# Patient Record
Sex: Male | Born: 1960 | Race: Black or African American | Hispanic: No | Marital: Married | Smoking: Never smoker
Health system: Southern US, Community
[De-identification: ages and names within clinical notes are randomized; demographics above are authoritative.]

---

## 2017-05-05 ENCOUNTER — Encounter (HOSPITAL_COMMUNITY): Payer: Self-pay | Admitting: Emergency Medicine

## 2017-05-05 ENCOUNTER — Emergency Department (HOSPITAL_COMMUNITY)
Admission: EM | Admit: 2017-05-05 | Discharge: 2017-05-05 | Disposition: A | Discharge status: Home / Self Care | Payer: BLUE CROSS/BLUE SHIELD | Attending: Emergency Medicine | Admitting: Emergency Medicine

## 2017-05-05 ENCOUNTER — Emergency Department (HOSPITAL_COMMUNITY): Payer: BLUE CROSS/BLUE SHIELD

## 2017-05-05 DIAGNOSIS — Z79899 Other long term (current) drug therapy: Secondary | ICD-10-CM | POA: Diagnosis not present

## 2017-05-05 DIAGNOSIS — R1013 Epigastric pain: Secondary | ICD-10-CM | POA: Insufficient documentation

## 2017-05-05 LAB — CBC
HCT: 51.9 % (ref 39.0–52.0)
Hemoglobin: 18.2 g/dL — ABNORMAL HIGH (ref 13.0–17.0)
MCH: 32.6 pg (ref 26.0–34.0)
MCHC: 35.1 g/dL (ref 30.0–36.0)
MCV: 93 fL (ref 78.0–100.0)
PLATELETS: 185 10*3/uL (ref 150–400)
RBC: 5.58 MIL/uL (ref 4.22–5.81)
RDW: 13.5 % (ref 11.5–15.5)
WBC: 5.3 10*3/uL (ref 4.0–10.5)

## 2017-05-05 LAB — COMPREHENSIVE METABOLIC PANEL
ALK PHOS: 58 U/L (ref 38–126)
ALT: 25 U/L (ref 17–63)
AST: 23 U/L (ref 15–41)
Albumin: 4.2 g/dL (ref 3.5–5.0)
Anion gap: 8 (ref 5–15)
BILIRUBIN TOTAL: 0.6 mg/dL (ref 0.3–1.2)
BUN: 5 mg/dL — AB (ref 6–20)
CALCIUM: 9.3 mg/dL (ref 8.9–10.3)
CHLORIDE: 104 mmol/L (ref 101–111)
CO2: 24 mmol/L (ref 22–32)
CREATININE: 1.14 mg/dL (ref 0.61–1.24)
GFR calc Af Amer: >60 mL/min (ref >60)
Glucose, Bld: 107 mg/dL — ABNORMAL HIGH (ref 65–99)
Potassium: 3.8 mmol/L (ref 3.5–5.1)
Sodium: 136 mmol/L (ref 135–145)
Total Protein: 7.8 g/dL (ref 6.5–8.1)

## 2017-05-05 LAB — LIPASE, BLOOD: Lipase: 26 U/L (ref 11–51)

## 2017-05-05 MED ORDER — HYDROMORPHONE HCL 1 MG/ML IJ SOLN
1.0000 mg | Freq: Once | INTRAMUSCULAR | Status: AC
  Administered 2017-05-05: 1 mg via INTRAVENOUS
  Filled 2017-05-05: qty 1

## 2017-05-05 MED ORDER — ONDANSETRON HCL 4 MG/2ML IJ SOLN
4.0000 mg | Freq: Once | INTRAMUSCULAR | Status: AC
  Administered 2017-05-05: 4 mg via INTRAVENOUS
  Filled 2017-05-05: qty 2

## 2017-05-05 MED ORDER — IOPAMIDOL (ISOVUE-300) INJECTION 61%
INTRAVENOUS | Status: AC
  Administered 2017-05-05: 100 mL via INTRAVENOUS
  Filled 2017-05-05: qty 100

## 2017-05-05 NOTE — ED Notes (Signed)
Patient transported to CT 

## 2017-05-05 NOTE — ED Triage Notes (Signed)
[facility]. Pt reports umbilicus abdominal pain onset yesterday. Hernia noted. Patient in NAD at present but states "they tried to push it back in today and it wouldn't go back." VSS. States BM this morning with some constipation, denies nausea, vomiting and diarrhea.

## 2017-05-05 NOTE — ED Notes (Signed)
IV team at bedside 

## 2017-05-05 NOTE — ED Provider Notes (Signed)
[facility]-EMERGENCY DEPT Provider Note   CSN: [id number] Arrival date & [id number]: 05/05/17  1510     History   Chief Complaint Chief Complaint  Patient presents with  . Abdominal Pain    HPI Carlos LankBruce Orozco is a 56 y.o. male.  Patient states that he has a protrusion from his umbilicus. It has been protruding out since yesterday morning and has been hurting ever since then the pain is getting worse   The history is provided by the patient.  Abdominal Pain   This is a new problem. The current episode started yesterday. The problem occurs constantly. The problem has not changed since onset.The pain is associated with an unknown factor. The pain is located in the epigastric region. The quality of the pain is aching. The pain is at a severity of 5/10. The pain is moderate. Associated symptoms include anorexia. Pertinent negatives include diarrhea, frequency, hematuria and headaches.    History reviewed. No pertinent past medical history.  There are no active problems to display for this patient.   History reviewed. No pertinent surgical history.     Home Medications    Prior to Admission medications   Medication Sig Start Date End Date Taking? Authorizing Provider  cetirizine-pseudoephedrine (ZYRTEC-D) 5-120 MG tablet Take 1 tablet by mouth 2 (two) times daily as needed for allergies or rhinitis.   Yes [provider]    Family History History reviewed. No pertinent family history.  Social History Social History  Substance Use Topics  . Smoking status: Never Smoker  . Smokeless tobacco: Never Used  . Alcohol use No     Allergies   Patient has no known allergies.   Review of Systems Review of Systems  Constitutional: Negative for appetite change and fatigue.  HENT: Negative for congestion, ear discharge and sinus pressure.   Eyes: Negative for discharge.  Respiratory: Negative for cough.   Cardiovascular: Negative for chest pain.  Gastrointestinal: Positive  for abdominal pain and anorexia. Negative for diarrhea.  Genitourinary: Negative for frequency and hematuria.  Musculoskeletal: Negative for back pain.  Skin: Negative for rash.  Neurological: Negative for seizures and headaches.  Psychiatric/Behavioral: Negative for hallucinations.     Physical Exam Updated Vital Signs BP (!) 149/94 (BP Location: Left Arm)   Pulse 75   Temp 98 F (36.7 C) (Oral)   Resp 16   Ht 5\' 9"  (1.753 m)   Wt 114.3 kg (252 lb)   SpO2 95%   BMI 37.21 kg/m   Physical Exam  Constitutional: He is oriented to person, place, and time. He appears well-developed.  HENT:  Head: Normocephalic.  Eyes: Conjunctivae and EOM are normal. No scleral icterus.  Neck: Neck supple. No thyromegaly present.  Cardiovascular: Normal rate and regular rhythm.  Exam reveals no gallop and no friction rub.   No murmur heard. Pulmonary/Chest: No stridor. He has no wheezes. He has no rales. He exhibits no tenderness.  Abdominal: He exhibits no distension. There is tenderness. There is no rebound.  Patient has a umbilical hernia with tissue protruding out. I was able to reduce the hernia with significant pressure  Musculoskeletal: Normal range of motion. He exhibits no edema.  Lymphadenopathy:    He has no cervical adenopathy.  Neurological: He is oriented to person, place, and time. He exhibits normal muscle tone. Coordination normal.  Skin: No rash noted. No erythema.  Psychiatric: He has a normal mood and affect. His behavior is normal.     ED Treatments / Results  Labs (all labs ordered are listed, but only abnormal results are displayed) Labs Reviewed  COMPREHENSIVE METABOLIC PANEL - Abnormal; Notable for the following:       Result Value   Glucose, Bld 107 (*)    BUN 5 (*)    All other components within normal limits  CBC - Abnormal; Notable for the following:    Hemoglobin 18.2 (*)    All other components within normal limits  LIPASE, BLOOD    EKG  EKG  Interpretation None       Radiology Ct Abdomen Pelvis W Contrast  Result Date: 05/05/2017 CLINICAL DATA:  56 year old male with umbilical hernia presenting with abdominal pain at the umbilicus. EXAM: CT ABDOMEN AND PELVIS WITH CONTRAST TECHNIQUE: Multidetector CT imaging of the abdomen and pelvis was performed using the standard protocol following bolus administration of intravenous contrast. CONTRAST:  ISOVUE-300 IOPAMIDOL (ISOVUE-300) INJECTION 61% COMPARISON:  None. FINDINGS: Lower chest: The visualized lung bases are clear. No intra-abdominal free air. There is mesenteric stranding without significant free fluid. Hepatobiliary: No focal liver abnormality is seen. No gallstones, gallbladder wall thickening, or biliary dilatation. Pancreas: Unremarkable. No pancreatic ductal dilatation or surrounding inflammatory changes. Spleen: Normal in size without focal abnormality. Adrenals/Urinary Tract: Adrenal glands are unremarkable. Kidneys are normal, without renal calculi, focal lesion, or hydronephrosis. Bladder is unremarkable. Layering high attenuating content within the bladder may represent excreted contrast or proteinaceous debris. Stomach/Bowel: There is mild thickening and inflammatory changes of loops of small bowel in the mid abdomen at the level of the umbilicus. Fluid-filled content noted within these loops of bowel. There is a focal area of caliber change in the small bowel (series 4, image 66 and coronal series 7, image 33) there is inflammatory changes of the associated mesentery. There is stranding and inflammatory changes of a small fat containing umbilical hernia. Findings may represent a strangulated or incarcerated fat containing umbilical hernia with associated reactive inflammation and ileus of the adjacent small bowel or alternatively findings may represent enteritis with reactive ileus or less likely an early/low grade small bowel obstruction. Clinical correlation is recommended.  Small scattered colonic diverticula noted. There is no inflammatory changes of the colon. The appendix is unremarkable. Vascular/Lymphatic: No significant vascular findings are present. No enlarged abdominal or pelvic lymph nodes. Reproductive: The prostate and seminal vesicles are grossly unremarkable. Other: A 2.2 cm peritoneal defect at the level of the umbilicus with a 5.2 x 4.6 cm fat containing umbilical hernia. Inflammatory changes and stranding of the herniated fat may strangulation/ incarceration or extension of the inflammatory changes of the adjacent bowel. Musculoskeletal: Mild degenerative changes of the spine. No acute fracture. IMPRESSION: Inflammatory changes of the loops of small bowel in the mid abdomen as well as inflammatory changes of fat containing umbilical hernia. Findings may represent a strangulated/incarcerated fat containing umbilical hernia with reactive inflammatory changes of the adjacent bowel or an enteritis with associated ileus and extension of the inflammatory changes into the umbilical hernia. Clinical correlation is recommended. An early/low grade small bowel obstruction is less likely. Electronically Signed   By: Elgie Collard M.D.   On: 05/05/2017 20:24    Procedures Procedures (including critical care time)  Medications Ordered in ED Medications  HYDROmorphone (DILAUDID) injection 1 mg (1 mg Intravenous Given 05/05/17 1901)  ondansetron (ZOFRAN) injection 4 mg (4 mg Intravenous Given 05/05/17 1901)  iopamidol (ISOVUE-300) 61 % injection (100 mLs Intravenous Contrast Given 05/05/17 1920)     Initial Impression / Assessment and  Plan / ED Course  I have reviewed the triage vital signs and the nursing notes.  Pertinent labs & imaging results that were available during my care of the patient were reviewed by me and considered in my medical decision making (see chart for details).     Patient with umbilical hernia. I spoke to general surgery and they will  follow him up in the office. The patient was instructed if the hernia occurs again and will not reduce easily he needs come back to the hospital  Final Clinical Impressions(s) / ED Diagnoses   Final diagnoses:  Epigastric pain    New Prescriptions New Prescriptions   No medications on file     Bethann Berkshire, MD 05/05/17 2048

## 2017-05-05 NOTE — Discharge Instructions (Signed)
Follow-up with Dr. Lindie SpruceWyatt or one of his partners in the next week. Return if the hernia comes back.

## 2017-05-05 NOTE — ED Notes (Signed)
Waiting for IV team °

## 2017-05-07 ENCOUNTER — Ambulatory Visit: Payer: Self-pay | Admitting: Surgery

## 2017-05-07 NOTE — H&P (Signed)
Carlos Orozco 05/06/2017 4:32 PM Location: [facility] Patient #: [id number] DOB: 1961-07-20 Married / Language: English / Race: Refused to Report/Unreported Male   History of Present Illness Carlos Sportsman MD; 05/07/2017 9:57 AM) The patient is a 56 year old male who presents with an umbilical hernia. Note for "Umbilical hernia": ` ` ` Patient sent for surgical consultation at the request of Dr. Raquel Orozco ED  Chief Complaint: Painful hernia at bellybutton  The patient is a pleasant active male. He's noticed a lump at his bellybutton for the past 6 months or so. His noted increasing sharp pains in the area for the past few weeks. Last weekend he awoke with severe intense pain. Able to massage and reduce the lump down. However the next day it was out again and even more painful. Could not get it to reduce. Concerned him. They went to the emergency room. CAT scan noted omentum within a umbilical hernia. Eventually the ER physician was able to reduce it. It was recommended that surgical consultation be made. We had an opening so the patient could be seen the following day. Patient has not had any prior abdominal nor hernia surgery. He does not smoke. Does have moderate activity at his job. He can walk a half hour without difficulty. No cardiopulmonary issues. Usually moves his bowels every day or so. He does feel like his bowels a been moving "a little slow"  No personal nor family history of GI/colon cancer, inflammatory bowel disease, irritable bowel syndrome, allergy such as Celiac Sprue, dietary/dairy problems, colitis, ulcers nor gastritis. No recent sick contacts/gastroenteritis. No travel outside the country. No changes in diet. No dysphagia to solids or liquids. No significant heartburn or reflux. No hematochezia, hematemesis, coffee ground emesis. No evidence of prior gastric/peptic ulceration.  (Review of systems as stated in this history  (HPI) or in the review of systems. Otherwise all other 12 point ROS are negative)   Past Surgical History Carlos Orozco, CMA; 05/06/2017 4:37 PM) Ventral / Umbilical Hernia Surgery  multiple  Diagnostic Studies History Carlos Orozco, CMA; 05/06/2017 4:37 PM) Colonoscopy  never  Allergies Carlos Orozco, CMA; 05/06/2017 4:38 PM) No Known Drug Allergies 05/06/2017 Allergies Reconciled   Medication History Carlos Orozco, CMA; 05/06/2017 4:39 PM) Medications Reconciled  Social History Carlos Orozco, CMA; 05/06/2017 4:37 PM) Alcohol use  Occasional alcohol use. Caffeine use  Tea. No drug use  Tobacco use  Never smoker.  Family History Carlos Orozco, [place]; 05/06/2017 4:37 PM) Heart Disease  Mother.  Other Problems Carlos Orozco, CMA; 05/06/2017 4:37 PM) Umbilical Hernia Repair     Review of Systems Carlos Orozco CMA; 05/06/2017 4:37 PM) General Not Present- Appetite Loss, Chills, Fatigue, Fever, Night Sweats, Weight Gain and Weight Loss. Skin Not Present- Change in Wart/Mole, Dryness, Hives, Jaundice, New Lesions, Non-Healing Wounds, Rash and Ulcer. HEENT Not Present- Earache, Hearing Loss, Hoarseness, Nose Bleed, Oral Ulcers, Ringing in the Ears, Seasonal Allergies, Sinus Pain, Sore Throat, Visual Disturbances, Wears glasses/contact lenses and Yellow Eyes. Respiratory Not Present- Bloody sputum, Chronic Cough, Difficulty Breathing, Snoring and Wheezing. Breast Not Present- Breast Mass, Breast Pain, Nipple Discharge and Skin Changes. Cardiovascular Not Present- Chest Pain, Difficulty Breathing Lying Down, Leg Cramps, Palpitations, Rapid Heart Rate, Shortness of Breath and Swelling of Extremities. Gastrointestinal Not Present- Abdominal Pain, Bloating, Bloody Stool, Change in Bowel Habits, Chronic diarrhea, Constipation, Difficulty Swallowing, Excessive gas, Gets full quickly at meals, Hemorrhoids, Indigestion, Nausea, Rectal Pain and Vomiting. Male Genitourinary Not  Present-  Blood in Urine, Change in Urinary Stream, Frequency, Impotence, Nocturia, Painful Urination, Urgency and Urine Leakage.  Vitals Carlos Orozco(Carlos Orozco CMA; 05/06/2017 4:39 PM) 05/06/2017 4:39 PM Weight: 248.4 lb Height: 69in Body Surface Area: 2.26 m Body Mass Index: 36.68 kg/m  Temp.: 97.24F  Pulse: 58 (Regular)  BP: 120/82 (Sitting, Left Arm, Standard)       Physical Exam Carlos Orozco(Carlos Belling C. Lastacia Solum MD; 05/06/2017 5:04 PM) General Mental Status-Alert. General Appearance-Not in acute distress, Not Sickly. Orientation-Oriented X3. Hydration-Well hydrated. Voice-Normal.  Integumentary Global Assessment Upon inspection and palpation of skin surfaces of the - Axillae: non-tender, no inflammation or ulceration, no drainage. and Distribution of scalp and body hair is normal. General Characteristics Temperature - normal warmth is noted.  Head and Neck Head-normocephalic, atraumatic with no lesions or palpable masses. Face Global Assessment - atraumatic, no absence of expression. Neck Global Assessment - no abnormal movements, no bruit auscultated on the right, no bruit auscultated on the left, no decreased range of motion, non-tender. Trachea-midline. Thyroid Gland Characteristics - non-tender.  Eye Eyeball - Left-Extraocular movements intact, No Nystagmus. Eyeball - Right-Extraocular movements intact, No Nystagmus. Cornea - Left-No Hazy. Cornea - Right-No Hazy. Sclera/Conjunctiva - Left-No scleral icterus, No Discharge. Sclera/Conjunctiva - Right-No scleral icterus, No Discharge. Pupil - Left-Direct reaction to light normal. Pupil - Right-Direct reaction to light normal.  ENMT Ears Pinna - Left - no drainage observed, no generalized tenderness observed. Right - no drainage observed, no generalized tenderness observed. Nose and Sinuses External Inspection of the Nose - no destructive lesion observed. Inspection of the nares - Left - quiet  respiration. Right - quiet respiration. Mouth and Throat Lips - Upper Lip - no fissures observed, no pallor noted. Lower Lip - no fissures observed, no pallor noted. Nasopharynx - no discharge present. Oral Cavity/Oropharynx - Tongue - no dryness observed. Oral Mucosa - no cyanosis observed. Hypopharynx - no evidence of airway distress observed.  Chest and Lung Exam Inspection Movements - Normal and Symmetrical. Accessory muscles - No use of accessory muscles in breathing. Palpation Palpation of the chest reveals - Non-tender. Auscultation Breath sounds - Normal and Clear.  Cardiovascular Auscultation Rhythm - Regular. Murmurs & Other Heart Sounds - Auscultation of the heart reveals - No Murmurs and No Systolic Clicks.  Abdomen Inspection Inspection of the abdomen reveals - No Visible peristalsis and No Abnormal pulsations. Umbilicus - No Bleeding, No Urine drainage. Palpation/Percussion Palpation and Percussion of the abdomen reveal - Soft, Non Tender, No Rebound tenderness, No Rigidity (guarding) and No Cutaneous hyperesthesia. Note: Obese but soft. 5 cm periumbilical mass reduces down to 2 cm defect. Consistent with umbilical hernia. No diastases. No pain elsewhere. No rebound tenderness. No guarding.   Male Genitourinary Sexual Maturity Tanner 5 - Adult hair pattern and Adult penile size and shape. Note: No inguinal hernias. Normal external genitalia. Epididymi, testes, and spermatic cords normal without any masses.   Peripheral Vascular Upper Extremity Inspection - Left - No Cyanotic nailbeds, Not Ischemic. Right - No Cyanotic nailbeds, Not Ischemic.  Neurologic Neurologic evaluation reveals -normal attention span and ability to concentrate, able to name objects and repeat phrases. Appropriate fund of knowledge , normal sensation and normal coordination. Mental Status Affect - not angry, not paranoid. Cranial Nerves-Normal  Bilaterally. Gait-Normal.  Neuropsychiatric Mental status exam performed with findings of-able to articulate well with normal speech/language, rate, volume and coherence, thought content normal with ability to perform basic computations and apply abstract reasoning and no evidence of hallucinations, delusions, obsessions or  homicidal/suicidal ideation.  Musculoskeletal Global Assessment Spine, Ribs and Pelvis - no instability, subluxation or laxity. Right Upper Extremity - no instability, subluxation or laxity.  Lymphatic Head & Neck  General Head & Neck Lymphatics: Bilateral - Description - No Localized lymphadenopathy. Axillary  General Axillary Region: Bilateral - Description - No Localized lymphadenopathy. Femoral & Inguinal  Generalized Femoral & Inguinal Lymphatics: Left - Description - No Localized lymphadenopathy. Right - Description - No Localized lymphadenopathy.    Assessment & Plan Carlos Sportsman MD; 05/07/2017 9:55 AM) UMBILICAL HERNIA WITHOUT OBSTRUCTION AND WITHOUT GANGRENE (K42.9) Impression: Umbilical hernia increasingly symptomatic with an episode of incarceration. Requires repair.  Given his young age and obesity and defect greater than 1 cm, I think this will require mesh reinforcement. I would recommend laparoscopic exploration with underlay repair  I discussed postop recovery and return to work. Patient and his spouse are interested in proceeding. PREOP - [facility] - ENCOUNTER FOR PREOPERATIVE EXAMINATION FOR GENERAL SURGICAL PROCEDURE (Z01.818) Current Plans You are being scheduled for surgery- Our schedulers will call you.  You should hear from our office's scheduling department within 5 working days about the location, date, and time of surgery. We try to make accommodations for patient's preferences in scheduling surgery, but sometimes the OR schedule or the surgeon's schedule prevents [place] from making those accommodations.  If you have not heard from our  office [phone]) in 5 working days, call the office and ask for your surgeon's nurse.  If you have other questions about your diagnosis, plan, or surgery, call the office and ask for your surgeon's nurse.  Written instructions provided The anatomy & physiology of the abdominal wall was discussed. The pathophysiology of hernias was discussed. Natural history risks without surgery including progeressive enlargement, pain, incarceration, & strangulation was discussed. Contributors to complications such as smoking, obesity, diabetes, prior surgery, etc were discussed.  I feel the risks of no intervention will lead to serious problems that outweigh the operative risks; therefore, I recommended surgery to reduce and repair the hernia. I explained laparoscopic techniques with possible need for an open approach. I noted the probable use of mesh to patch and/or buttress the hernia repair  Risks such as bleeding, infection, abscess, need for further treatment, heart attack, death, and other risks were discussed. I noted a good likelihood this will help address the problem. Goals of post-operative recovery were discussed as well. Possibility that this will not correct all symptoms was explained. I stressed the importance of low-impact activity, aggressive pain control, avoiding constipation, & not pushing through pain to minimize risk of post-operative chronic pain or injury. Possibility of reherniation especially with smoking, obesity, diabetes, immunosuppression, and other health conditions was discussed. We will work to minimize complications.  An educational handout further explaining the pathology & treatment options was given as well. Questions were answered. The patient expresses understanding & wishes to proceed with surgery.  Pt Education - [facility] Hernia Post-Op HCI (Jagdeep Ancheta): discussed with patient and provided information. Pt Education - [facility] Pain Control (Mandell Pangborn) Pt Education - Pamphlet  Given - Laparoscopic Hernia Repair: discussed with patient and provided information.   Signed by Carlos Sportsman, MD (05/07/2017 9:58 AM)  Carlos Orozco, M.D., F.A.C.S. Gastrointestinal and Minimally Invasive Surgery [facility] [address] [place], [place] [address] [phone] Main / Paging

## 2018-01-16 IMAGING — CT CT ABD-PELV W/ CM
2 of 5 series · 16 of 46 positions shown, 18 images · IV contrast (iopamidol)
Comparison: None.

CLINICAL DATA: 55-year-old male with umbilical hernia presenting
with abdominal pain at the umbilicus.

EXAM:
CT ABDOMEN AND PELVIS WITH CONTRAST
TECHNIQUE: Multidetector CT imaging of the abdomen and pelvis was performed
using the standard protocol following bolus administration of
intravenous contrast.
CONTRAST:  100mL [id number]-Q22 IOPAMIDOL (VY3I0G-Q22) INJECTION 61%

[Series 4: abd/ pelvis 5.0 i30f 2 · axial · 0.88mm/px · z∈[+780,+1234]mm · 13 of 103 slices shown, 15 images]
[im 6/103  soft-tissue]
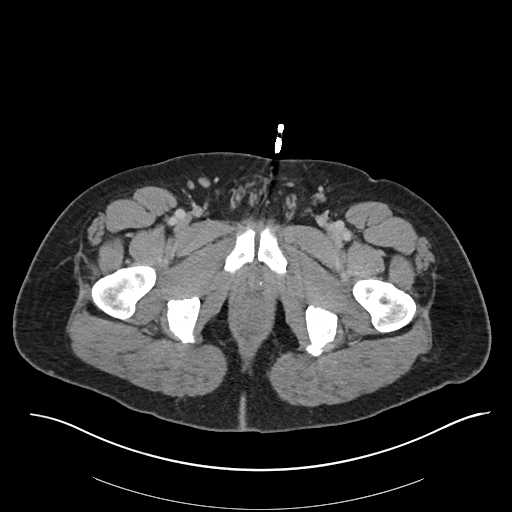
[im 6/103  bone]
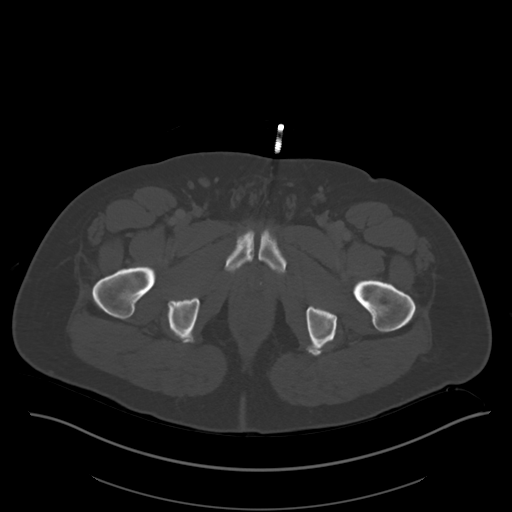
[im 17/103  soft-tissue]
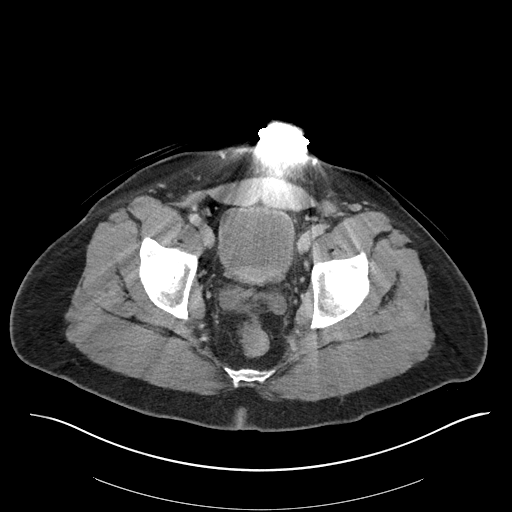
[im 22/103  soft-tissue]
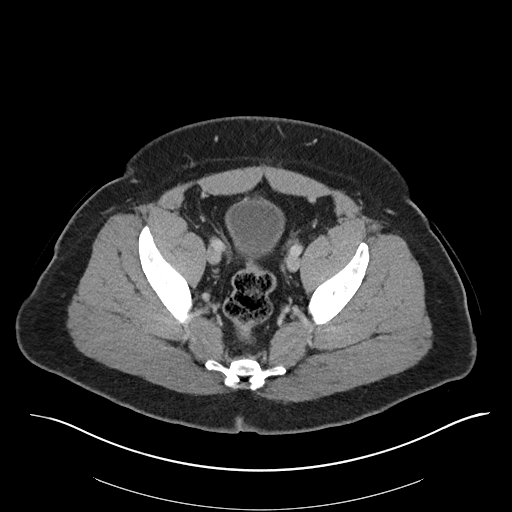
[im 27/103  soft-tissue]
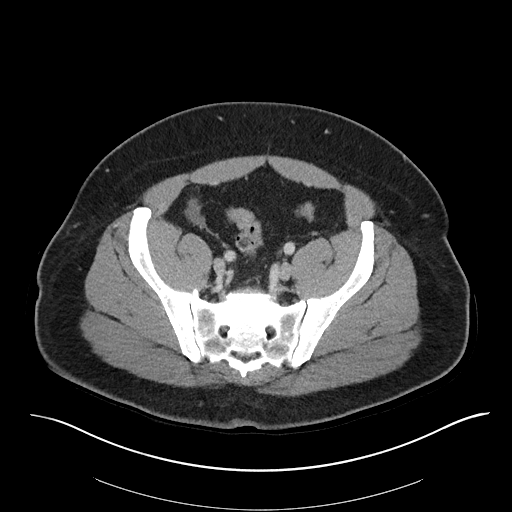
[im 38/103  soft-tissue]
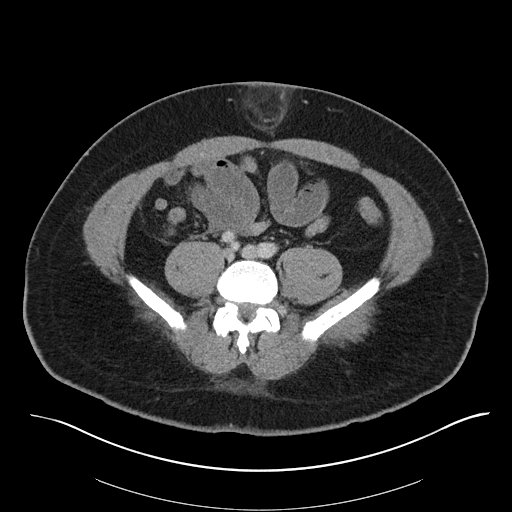
[im 43/103  soft-tissue]
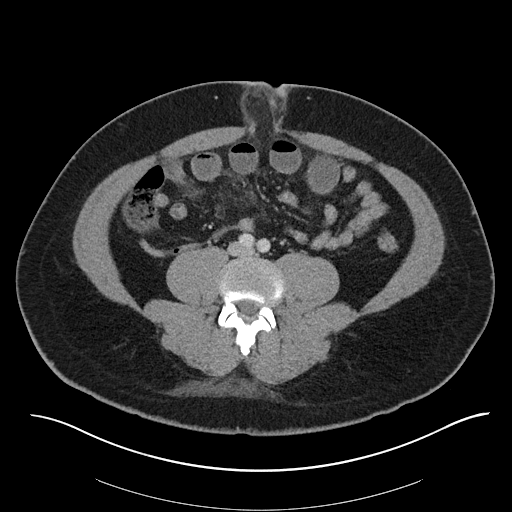
[im 54/103  soft-tissue]
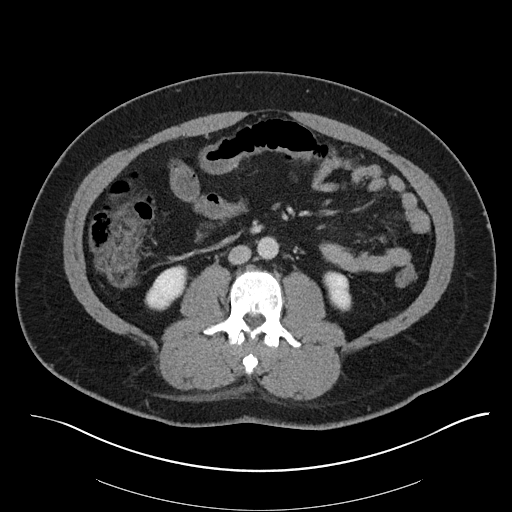
[im 60/103  soft-tissue]
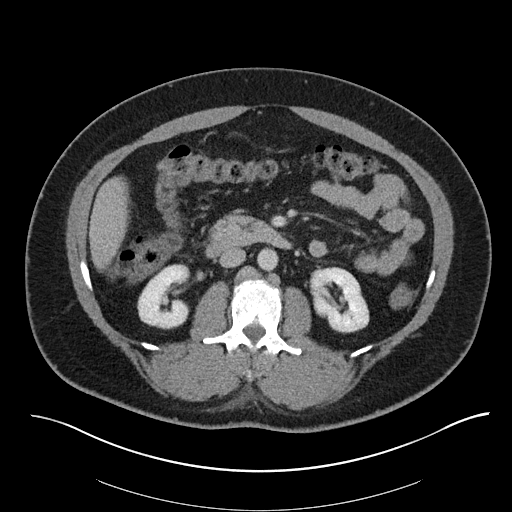
[im 65/103  soft-tissue]
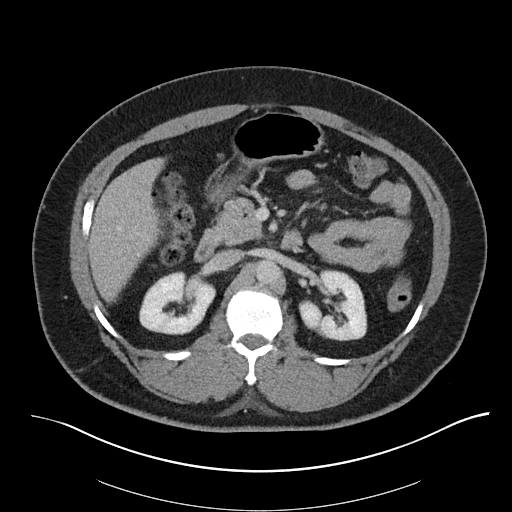
[im 65/103  bone]
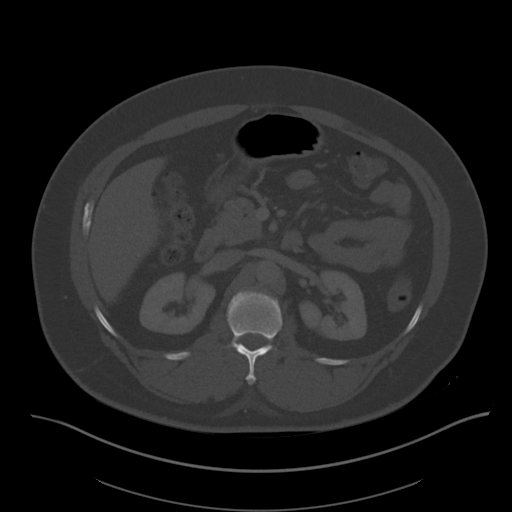
[im 76/103  soft-tissue]
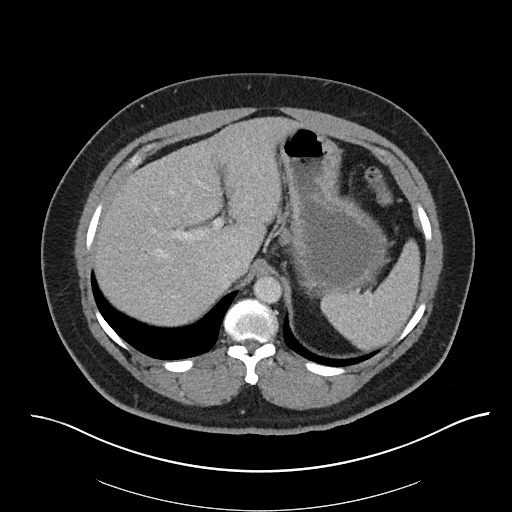
[im 81/103  soft-tissue]
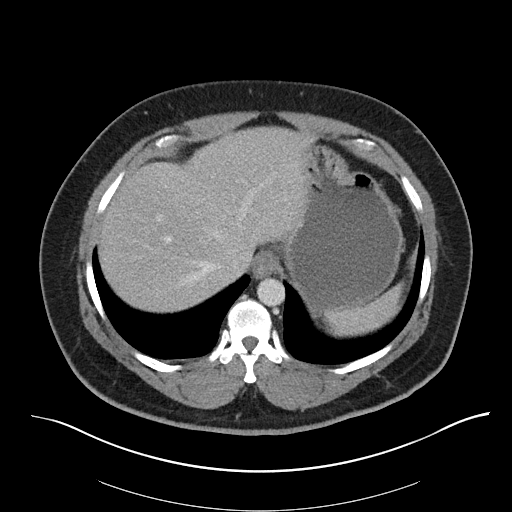
[im 86/103  soft-tissue]
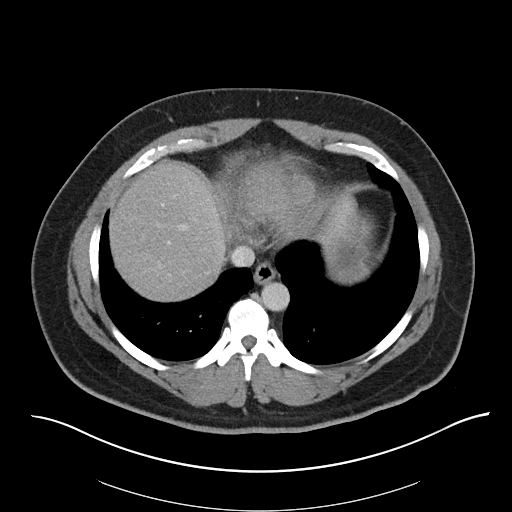
[im 97/103  soft-tissue]
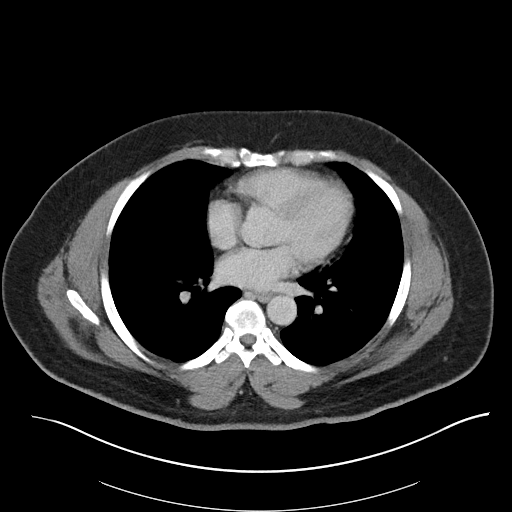

[Series 7: coronal soft tissue · coronal · 1.00mm/px · 3 of 105 slices shown]
[im 35/105  soft-tissue]
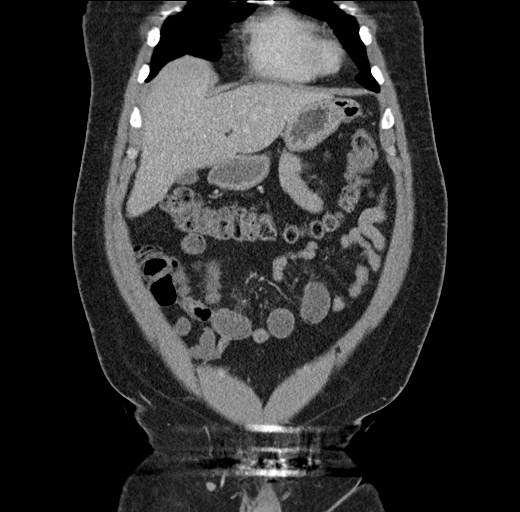
[im 47/105  soft-tissue]
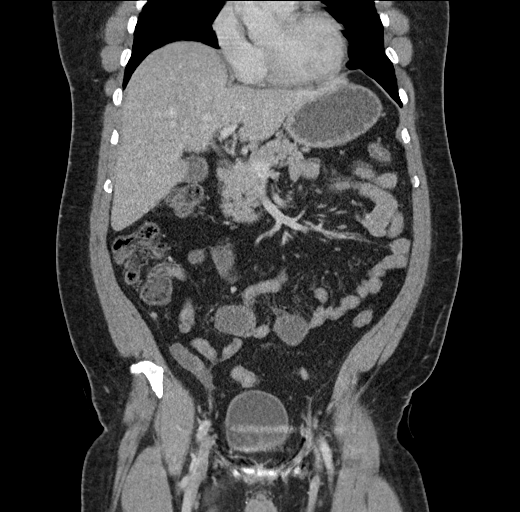
[im 58/105  soft-tissue]
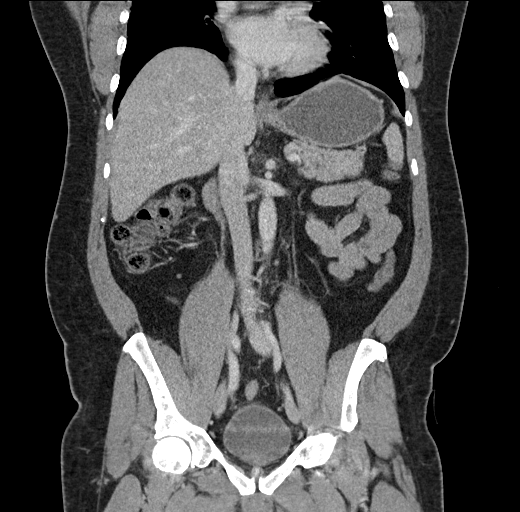

[16 of 46 positions shown; findings below may reference images not displayed]

FINDINGS: Lower chest: The visualized lung bases are clear.

No intra-abdominal free air. There is mesenteric stranding without
significant free fluid.

Hepatobiliary: No focal liver abnormality is seen. No gallstones,
gallbladder wall thickening, or biliary dilatation.

Pancreas: Unremarkable. No pancreatic ductal dilatation or
surrounding inflammatory changes.

Spleen: Normal in size without focal abnormality.

Adrenals/Urinary Tract: Adrenal glands are unremarkable. Kidneys are
normal, without renal calculi, focal lesion, or hydronephrosis.
Bladder is unremarkable. Layering high attenuating content within
the bladder may represent excreted contrast or proteinaceous debris.

Stomach/Bowel: There is mild thickening and inflammatory changes of
loops of small bowel in the mid abdomen at the level of the
umbilicus. Fluid-filled content noted within these loops of bowel.
There is a focal area of caliber change in the small bowel (series
4, image 66 and coronal series 7, image 33) there is inflammatory
changes of the associated mesentery. There is stranding and
inflammatory changes of a small fat containing umbilical hernia.
Findings may represent a strangulated or incarcerated fat containing
umbilical hernia with associated reactive inflammation and ileus of
the adjacent small bowel or alternatively findings may represent
enteritis with reactive ileus or less likely an early/low grade
small bowel obstruction. Clinical correlation is recommended. Small
scattered colonic diverticula noted. There is no inflammatory
changes of the colon. The appendix is unremarkable.

Vascular/Lymphatic: No significant vascular findings are present. No
enlarged abdominal or pelvic lymph nodes.

Reproductive: The prostate and seminal vesicles are grossly
unremarkable.

Other: A 2.2 cm peritoneal defect at the level of the umbilicus with
a 5.2 x 4.6 cm fat containing umbilical hernia. Inflammatory changes
and stranding of the herniated fat may strangulation/ incarceration
or extension of the inflammatory changes of the adjacent bowel.

Musculoskeletal: Mild degenerative changes of the spine. No acute
fracture.
IMPRESSION: Inflammatory changes of the loops of small bowel in the mid abdomen
as well as inflammatory changes of fat containing umbilical hernia.
Findings may represent a strangulated/incarcerated fat containing
umbilical hernia with reactive inflammatory changes of the adjacent
bowel or an enteritis with associated ileus and extension of the
inflammatory changes into the umbilical hernia. Clinical correlation
is recommended. An early/low grade small bowel obstruction is less
likely.

## 2021-01-10 ENCOUNTER — Ambulatory Visit (HOSPITAL_COMMUNITY)
Admission: EM | Admit: 2021-01-10 | Discharge: 2021-01-10 | Disposition: A | Discharge status: Home / Self Care | Payer: BC Managed Care – PPO | Attending: Family Medicine | Admitting: Family Medicine

## 2021-01-10 ENCOUNTER — Encounter (HOSPITAL_COMMUNITY): Payer: Self-pay

## 2021-01-10 ENCOUNTER — Other Ambulatory Visit: Payer: Self-pay

## 2021-01-10 DIAGNOSIS — K644 Residual hemorrhoidal skin tags: Secondary | ICD-10-CM

## 2021-01-10 DIAGNOSIS — K648 Other hemorrhoids: Secondary | ICD-10-CM

## 2021-01-10 MED ORDER — HYDROCORTISONE (PERIANAL) 2.5 % EX CREA: 1.0000 "application " | TOPICAL_CREAM | Freq: Two times a day (BID) | CUTANEOUS | 1 refills | Status: DC

## 2021-01-10 NOTE — ED Triage Notes (Signed)
Pt presents with rectal bleeding x this morning. Pt states he noticed his bowel movements were kind of hard. Pt denies emesis and nausea.

## 2021-01-10 NOTE — ED Provider Notes (Signed)
[facility]    CSN: [id number] Arrival date & time: 01/10/21  0825      History   Chief Complaint Chief Complaint  Patient presents with  . Rectal Bleeding    HPI Carlos Orozco is a 60 y.o. male.   Here today with an episode of bright red blood with a strained, hard BM this morning. States he's been a bit constipated the last few weeks and has been treating some hemorrhoid issues at home with prep H but has never had the bleeding until this morning. Blood was in the toilet bowl and with wiping, stopped immediately after BM. No pain at any time. Had a second BM an hour or so later with no bleeding. Denies abdominal pain, fever, chills, weakness, dizziness, unintentional weight loss.      History reviewed. No pertinent past medical history.  There are no problems to display for this patient.   History reviewed. No pertinent surgical history.     Home Medications    Prior to Admission medications   Medication Sig Start Date End Date Taking? Authorizing Provider  hydrocortisone (ANUSOL-HC) 2.5 % rectal cream Place 1 application rectally 2 (two) times daily. 01/10/21  Yes Particia Nearing, PA-C  cetirizine-pseudoephedrine (ZYRTEC-D) 5-120 MG tablet Take 1 tablet by mouth 2 (two) times daily as needed for allergies or rhinitis.    [provider]    Family History History reviewed. No pertinent family history.  Social History Social History   Tobacco Use  . Smoking status: Never Smoker  . Smokeless tobacco: Never Used  Substance Use Topics  . Alcohol use: No  . Drug use: No     Allergies   Patient has no known allergies.   Review of Systems Review of Systems PER HPI    Physical Exam Triage Vital Signs ED Triage Vitals  Enc Vitals Group     BP 01/10/21 0912 (!) 147/86     Pulse Rate 01/10/21 0910 66     Resp 01/10/21 0910 16     Temp 01/10/21 0912 99.6 F (37.6 C)     Temp Source 01/10/21 0912 Oral     SpO2 01/10/21 0910 98  %     Weight --      Height --      Head Circumference --      Peak Flow --      Pain Score 01/10/21 0910 0     Pain Loc --      Pain Edu? --      Excl. in GC? --    No data found.  Updated Vital Signs BP (!) 147/86 (BP Location: Right Arm)   Pulse 66   Temp 99.6 F (37.6 C) (Oral)   Resp 16   SpO2 98%   Visual Acuity Right Eye Distance:   Left Eye Distance:   Bilateral Distance:    Right Eye Near:   Left Eye Near:    Bilateral Near:     Physical Exam Vitals and nursing note reviewed. Exam conducted with a chaperone present.  Constitutional:      Appearance: Normal appearance.  HENT:     Head: Atraumatic.  Eyes:     Extraocular Movements: Extraocular movements intact.     Conjunctiva/sclera: Conjunctivae normal.  Cardiovascular:     Rate and Rhythm: Normal rate and regular rhythm.  Pulmonary:     Effort: Pulmonary effort is normal.     Breath sounds: Normal breath sounds.  Abdominal:  General: Bowel sounds are normal. There is no distension.     Palpations: Abdomen is soft.     Tenderness: There is no abdominal tenderness. There is no right CVA tenderness, left CVA tenderness or guarding.  Genitourinary:    Comments: Several inflamed external hemorrhoids, inflamed internal hemorrhoids palpable on digital rectal exam. No active bleeding present, non-tender to palpation Musculoskeletal:        General: Normal range of motion.     Cervical back: Normal range of motion and neck supple.  Skin:    General: Skin is warm and dry.  Neurological:     General: No focal deficit present.     Mental Status: He is oriented to person, place, and time.  Psychiatric:        Mood and Affect: Mood normal.        Thought Content: Thought content normal.        Judgment: Judgment normal.     UC Treatments / Results  Labs (all labs ordered are listed, but only abnormal results are displayed) Labs Reviewed - No data to display  EKG   Radiology No results  found.  Procedures Procedures (including critical care time)  Medications Ordered in UC Medications - No data to display  Initial Impression / Assessment and Plan / UC Course  I have reviewed the triage vital signs and the nursing notes.  Pertinent labs & imaging results that were available during my care of the patient were reviewed by me and considered in my medical decision making (see chart for details).     Bleeding resolved, no pain at site, suspect related to inflamed hemorrhoids/constipation. Discussed anusol cream, sitz baths, good bowel regimen with fiber, fluids. Return if sxs worsening or not resolving.   Final Clinical Impressions(s) / UC Diagnoses   Final diagnoses:  Inflamed external hemorrhoid  Inflamed internal hemorrhoid     Discharge Instructions     Take a fiber supplement daily, drink plenty of water, eat a high fiber diet, avoid straining during bowel movements  Sitz baths, tucks wipes, anusol cream as needed    ED Prescriptions    Medication Sig Dispense Auth. Provider   hydrocortisone (ANUSOL-HC) 2.5 % rectal cream Place 1 application rectally 2 (two) times daily. 90 g Particia Nearing, [place]     PDMP not reviewed this encounter.   Particia Nearing, [place] 01/10/21 1236

## 2021-01-10 NOTE — ED Notes (Signed)
At bedside for provider exam of rectal tissue

## 2021-01-10 NOTE — Discharge Instructions (Signed)
Take a fiber supplement daily, drink plenty of water, eat a high fiber diet, avoid straining during bowel movements  Sitz baths, tucks wipes, anusol cream as needed

## 2022-01-12 ENCOUNTER — Emergency Department (HOSPITAL_BASED_OUTPATIENT_CLINIC_OR_DEPARTMENT_OTHER)
Admission: EM | Admit: 2022-01-12 | Discharge: 2022-01-12 | Disposition: A | Discharge status: Home / Self Care | Payer: BC Managed Care – PPO | Attending: Emergency Medicine | Admitting: Emergency Medicine

## 2022-01-12 ENCOUNTER — Encounter (HOSPITAL_BASED_OUTPATIENT_CLINIC_OR_DEPARTMENT_OTHER): Payer: Self-pay | Admitting: *Deleted

## 2022-01-12 ENCOUNTER — Other Ambulatory Visit: Payer: Self-pay

## 2022-01-12 DIAGNOSIS — X58XXXA Exposure to other specified factors, initial encounter: Secondary | ICD-10-CM | POA: Insufficient documentation

## 2022-01-12 DIAGNOSIS — S39012A Strain of muscle, fascia and tendon of lower back, initial encounter: Secondary | ICD-10-CM | POA: Insufficient documentation

## 2022-01-12 DIAGNOSIS — S3992XA Unspecified injury of lower back, initial encounter: Secondary | ICD-10-CM | POA: Diagnosis present

## 2022-01-12 DIAGNOSIS — K649 Unspecified hemorrhoids: Secondary | ICD-10-CM

## 2022-01-12 DIAGNOSIS — K644 Residual hemorrhoidal skin tags: Secondary | ICD-10-CM | POA: Diagnosis not present

## 2022-01-12 DIAGNOSIS — K625 Hemorrhage of anus and rectum: Secondary | ICD-10-CM

## 2022-01-12 LAB — CBC WITH DIFFERENTIAL/PLATELET
Abs Immature Granulocytes: 0.02 10*3/uL (ref 0.00–0.07)
Basophils Absolute: 0 10*3/uL (ref 0.0–0.1)
Basophils Relative: 1 %
Eosinophils Absolute: 0.1 10*3/uL (ref 0.0–0.5)
Eosinophils Relative: 4 %
HCT: 47.1 % (ref 39.0–52.0)
Hemoglobin: 16.2 g/dL (ref 13.0–17.0)
Immature Granulocytes: 1 %
Lymphocytes Relative: 28 %
Lymphs Abs: 1.1 10*3/uL (ref 0.7–4.0)
MCH: 32.7 pg (ref 26.0–34.0)
MCHC: 34.4 g/dL (ref 30.0–36.0)
MCV: 95 fL (ref 80.0–100.0)
Monocytes Absolute: 0.4 10*3/uL (ref 0.1–1.0)
Monocytes Relative: 10 %
Neutro Abs: 2.1 10*3/uL (ref 1.7–7.7)
Neutrophils Relative %: 56 %
Platelets: 212 10*3/uL (ref 150–400)
RBC: 4.96 MIL/uL (ref 4.22–5.81)
RDW: 13.4 % (ref 11.5–15.5)
WBC: 3.7 10*3/uL — ABNORMAL LOW (ref 4.0–10.5)
nRBC: 0 % (ref 0.0–0.2)

## 2022-01-12 LAB — BASIC METABOLIC PANEL
Anion gap: 5 (ref 5–15)
BUN: 11 mg/dL (ref 6–20)
CO2: 26 mmol/L (ref 22–32)
Calcium: 8.7 mg/dL — ABNORMAL LOW (ref 8.9–10.3)
Chloride: 106 mmol/L (ref 98–111)
Creatinine, Ser: 1.05 mg/dL (ref 0.61–1.24)
GFR, Estimated: >60 mL/min (ref >60)
Glucose, Bld: 105 mg/dL — ABNORMAL HIGH (ref 70–99)
Potassium: 4.4 mmol/L (ref 3.5–5.1)
Sodium: 137 mmol/L (ref 135–145)

## 2022-01-12 LAB — URINALYSIS, ROUTINE W REFLEX MICROSCOPIC
Bilirubin Urine: NEGATIVE
Glucose, UA: NEGATIVE mg/dL
Hgb urine dipstick: NEGATIVE
Ketones, ur: NEGATIVE mg/dL
Leukocytes,Ua: NEGATIVE
Nitrite: NEGATIVE
Protein, ur: NEGATIVE mg/dL
Specific Gravity, Urine: >=1.03 (ref 1.005–1.030)
pH: 5.5 (ref 5.0–8.0)

## 2022-01-12 LAB — OCCULT BLOOD X 1 CARD TO LAB, STOOL: Fecal Occult Bld: NEGATIVE

## 2022-01-12 MED ORDER — HYDROCORTISONE (PERIANAL) 2.5 % EX CREA: 1.0000 "application " | TOPICAL_CREAM | Freq: Two times a day (BID) | CUTANEOUS | 0 refills | Status: AC

## 2022-01-12 MED ORDER — METHOCARBAMOL 500 MG PO TABS: 500.0000 mg | ORAL_TABLET | Freq: Two times a day (BID) | ORAL | 0 refills | Status: AC

## 2022-01-12 NOTE — ED Notes (Signed)
Pt in bed, pt denies pain, pt states that he had some bright red rectal bleeding.  Resps even and unlabored, abd soft with bowel sounds.  Sig other at bedside

## 2022-01-12 NOTE — ED Provider Notes (Signed)
[facility] EMERGENCY DEPARTMENT Provider Note   CSN: [id number] Arrival date & time: 01/12/22  0820     History  Chief Complaint  Patient presents with   Rectal Bleeding    Carlos Orozco is a 61 y.o. male.  Pt is a 61 yo aa male with a hx of hemorrhoids.  He said he had a bowel movement this am and had some blood in his stool.  He had a 2nd bowel movement, but this time, there was no blood.  He's been taking miralax for constipation and his stools have been soft.  He does not feel that he's been straining.  He did have some low back pain on Monday and Tuesday of this week.  He used some otc pain patches and it did help.  Pt said the back pain is better.  He is worried they are related.  He did not have any numbness or weakness in his legs.  He has not had any abd pain or urinary sx.  No f/c.  No n/v.      Home Medications Prior to Admission medications   Medication Sig Start Date End Date Taking? Authorizing Provider  hydrocortisone (ANUSOL-HC) 2.5 % rectal cream Place 1 application rectally 2 (two) times daily. 01/12/22  Yes Jacalyn Lefevre, MD  cetirizine-pseudoephedrine (ZYRTEC-D) 5-120 MG tablet Take 1 tablet by mouth 2 (two) times daily as needed for allergies or rhinitis.    [provider]      Allergies    Patient has no known allergies.    Review of Systems   Review of Systems  Gastrointestinal:  Positive for blood in stool.  Musculoskeletal:  Positive for back pain.  All other systems reviewed and are negative.  Physical Exam Updated Vital Signs BP (!) 133/91    Pulse 63    Temp 97.8 F (36.6 C) (Oral)    Resp 18    Ht 5\' 9"  (1.753 m)    Wt 113.4 kg    SpO2 100%    BMI 36.92 kg/m  Physical Exam Vitals and nursing note reviewed. Exam conducted with a chaperone present.  Constitutional:      Appearance: Normal appearance. He is obese.  HENT:     Head: Normocephalic and atraumatic.     Right Ear: External ear normal.     Left Ear: External  ear normal.     Nose: Nose normal.     Mouth/Throat:     Mouth: Mucous membranes are moist.     Pharynx: Oropharynx is clear.  Eyes:     Extraocular Movements: Extraocular movements intact.     Conjunctiva/sclera: Conjunctivae normal.     Pupils: Pupils are equal, round, and reactive to light.  Cardiovascular:     Rate and Rhythm: Normal rate and regular rhythm.     Pulses: Normal pulses.     Heart sounds: Normal heart sounds.  Pulmonary:     Effort: Pulmonary effort is normal.     Breath sounds: Normal breath sounds.  Abdominal:     General: Abdomen is flat. Bowel sounds are normal.     Palpations: Abdomen is soft.  Genitourinary:    Rectum: Guaiac result negative. External hemorrhoid present.  Musculoskeletal:        General: Normal range of motion.     Cervical back: Normal range of motion and neck supple.  Skin:    General: Skin is warm.     Capillary Refill: Capillary refill takes less than 2 seconds.  Neurological:     General: No focal deficit present.     Mental Status: He is alert and oriented to person, place, and time.  Psychiatric:        Mood and Affect: Mood normal.        Behavior: Behavior normal.    ED Results / Procedures / Treatments   Labs (all labs ordered are listed, but only abnormal results are displayed) Labs Reviewed  BASIC METABOLIC PANEL - Abnormal; Notable for the following components:      Result Value   Glucose, Bld 105 (*)    Calcium 8.7 (*)    All other components within normal limits  CBC WITH DIFFERENTIAL/PLATELET - Abnormal; Notable for the following components:   WBC 3.7 (*)    All other components within normal limits  OCCULT BLOOD X 1 CARD TO LAB, STOOL  URINALYSIS, ROUTINE W REFLEX MICROSCOPIC    EKG None  Radiology No results found.  Procedures Procedures    Medications Ordered in ED Medications - No data to display  ED Course/ Medical Decision Making/ A&P                           Medical Decision  Making Amount and/or Complexity of Data Reviewed Labs: ordered.  Risk Prescription drug management.   This patient presents to the ED for concern of back pain and rectal bleeding, this involves an extensive number of treatment options, and is a complaint that carries with it a high risk of complications and morbidity.  The differential diagnosis includes msk, uti, infection, hemorrhoids, ugi or lower gi bleeding   Co morbidities that complicate the patient evaluation  obesity   Additional history obtained:  Additional history obtained from epic chart review External records from outside source obtained and reviewed including wife   Lab Tests:  I Ordered, and personally interpreted labs.  The pertinent results include:  guaiac neg, cbc nl, bmp nl, ua nl   Cardiac Monitoring:  The patient was maintained on a cardiac monitor.  I personally viewed and interpreted the cardiac monitored which showed an underlying rhythm of: nsr   Medicines ordered and prescription drug management:  I have reviewed the patients home medicines and have made adjustments as needed   Test Considered:  Ct abd/pelvis:  however, pt does not have back pain any more and no abd pain   Problem List / ED Course:  Rectal bleeding:  likely due to hemorrhoids.  Pt d/c with anusol hc.  Pt has never had a colonoscopy, so he is referred to GI.   Back pain:  Gone now.  Likely msk   Reevaluation:  After the interventions noted above, I reevaluated the patient and found that they have :improved   Social Determinants of Health:  Lives at home with wife   Dispostion:  After consideration of the diagnostic results and the patients response to treatment, I feel that the patent would benefit from discharge with close outpatient f/u.          Final Clinical Impression(s) / ED Diagnoses Final diagnoses:  Rectal bleeding  Hemorrhoids, unspecified hemorrhoid type  Strain of lumbar region, initial  encounter    Rx / DC Orders ED Discharge Orders          Ordered    hydrocortisone (ANUSOL-HC) 2.5 % rectal cream  2 times daily        01/12/22 1038    Ambulatory referral to Gastroenterology  Comments: Rectal bleeding.  No prior colonoscopy.   01/12/22 1038              Jacalyn Lefevre, MD 01/12/22 1041

## 2022-01-12 NOTE — ED Triage Notes (Addendum)
Rectal bleeding bright red blood onset this am,  also lower back pain onset monday

## 2022-04-13 ENCOUNTER — Encounter: Payer: Self-pay | Admitting: Emergency Medicine

## 2022-06-13 ENCOUNTER — Emergency Department (HOSPITAL_BASED_OUTPATIENT_CLINIC_OR_DEPARTMENT_OTHER): Payer: BC Managed Care – PPO

## 2022-06-13 ENCOUNTER — Encounter (HOSPITAL_BASED_OUTPATIENT_CLINIC_OR_DEPARTMENT_OTHER): Payer: Self-pay | Admitting: Pediatrics

## 2022-06-13 ENCOUNTER — Emergency Department (HOSPITAL_BASED_OUTPATIENT_CLINIC_OR_DEPARTMENT_OTHER)
Admission: EM | Admit: 2022-06-13 | Discharge: 2022-06-13 | Disposition: A | Discharge status: Home / Self Care | Payer: BC Managed Care – PPO | Attending: Emergency Medicine | Admitting: Emergency Medicine

## 2022-06-13 ENCOUNTER — Other Ambulatory Visit: Payer: Self-pay

## 2022-06-13 DIAGNOSIS — M25561 Pain in right knee: Secondary | ICD-10-CM | POA: Diagnosis present

## 2022-06-13 DIAGNOSIS — M25461 Effusion, right knee: Secondary | ICD-10-CM | POA: Diagnosis not present

## 2022-06-13 MED ORDER — KETOROLAC TROMETHAMINE 60 MG/2ML IM SOLN
30.0000 mg | Freq: Once | INTRAMUSCULAR | Status: AC
Start: 2022-06-13 — End: 2022-06-13
  Administered 2022-06-13: 30 mg via INTRAMUSCULAR
  Filled 2022-06-13: qty 2

## 2022-06-13 MED ORDER — HYDROCODONE-ACETAMINOPHEN 5-325 MG PO TABS
1.0000 | ORAL_TABLET | Freq: Once | ORAL | Status: AC
  Administered 2022-06-13: 1 via ORAL
  Filled 2022-06-13: qty 1

## 2022-06-13 NOTE — ED Triage Notes (Signed)
Reported starting having right knee pain on Thursday; [facility] and was given a "pain shot" but pain is worst at night and at rest; reports increased swelling and tenderness on the both sides of knees upon palpation.

## 2022-06-13 NOTE — ED Provider Notes (Signed)
[facility] EMERGENCY DEPARTMENT Provider Note   CSN: [id number] Arrival date & time: 06/13/22  2563     History  Chief Complaint  Patient presents with   Knee Pain    Carlos Orozco is a 61 y.o. male.   Knee Pain   61 year old male presenting to the emergency department with right knee pain.  The pain began last Thursday when he felt a pop and felt something give out.  He denies any recent trauma.  He has had trouble with range of motion of the knee and has had some swelling of the knee.  He denies any fevers or chills.  He denies any redness.  [facility] and states that he was given an injection but is not sure what it was whether it was steroid or pain medicine.  He states the pain is worse at night and at rest.  He endorses increased swelling and tenderness on both sides of the knee.  Home Medications Prior to Admission medications   Medication Sig Start Date End Date Taking? Authorizing Provider  cetirizine-pseudoephedrine (ZYRTEC-D) 5-120 MG tablet Take 1 tablet by mouth 2 (two) times daily as needed for allergies or rhinitis.    [provider]  hydrocortisone (ANUSOL-HC) 2.5 % rectal cream Place 1 application rectally 2 (two) times daily. 01/12/22   Jacalyn Lefevre, MD  methocarbamol (ROBAXIN) 500 MG tablet Take 1 tablet (500 mg total) by mouth 2 (two) times daily. 01/12/22   Jacalyn Lefevre, MD      Allergies    Patient has no known allergies.    Review of Systems   Review of Systems  All other systems reviewed and are negative.   Physical Exam Updated Vital Signs BP 139/81   Pulse 68   Temp 98.2 F (36.8 C) (Oral)   Resp 16   Ht 5\' 9"  (1.753 m)   Wt 113.4 kg   SpO2 98%   BMI 36.92 kg/m  Physical Exam Vitals and nursing note reviewed.  Constitutional:      General: He is not in acute distress. HENT:     Head: Normocephalic and atraumatic.  Eyes:     Conjunctiva/sclera: Conjunctivae normal.     Pupils:  Pupils are equal, round, and reactive to light.  Cardiovascular:     Rate and Rhythm: Normal rate and regular rhythm.  Pulmonary:     Effort: Pulmonary effort is normal. No respiratory distress.  Abdominal:     General: There is no distension.     Tenderness: There is no guarding.  Musculoskeletal:        General: Swelling and tenderness present. No deformity or signs of injury.     Cervical back: Neck supple.     Comments: Right knee with no significant tenderness palpation about the tibial plateau, mild joint effusion palpated, no significant erythema, minimal tenderness palpation of the joint itself, some pain with range of motion but range of motion overall intact passively, no significant instability on valgus or varus stress.  Distally neurovascular intact with 2+ DP pulses and intact sensation to light touch.  Patient able to raise the leg with extension of the knee.  Skin:    Findings: No lesion or rash.  Neurological:     General: No focal deficit present.     Mental Status: He is alert. Mental status is at baseline.     ED Results / Procedures / Treatments   Labs (all labs ordered are  listed, but only abnormal results are displayed) Labs Reviewed - No data to display  EKG None  Radiology CT Knee Right Wo Contrast  Result Date: 06/13/2022 CLINICAL DATA:  Knee trauma. Question of medial tibial plateau fracture on radiograph. Right knee pain. EXAM: CT OF THE RIGHT KNEE WITHOUT CONTRAST TECHNIQUE: Multidetector CT imaging of the right knee was performed according to the standard protocol. Multiplanar CT image reconstructions were also generated. RADIATION DOSE REDUCTION: This exam was performed according to the departmental dose-optimization program which includes automated exposure control, adjustment of the mA and/or kV according to patient size and/or use of iterative reconstruction technique. COMPARISON:  Right knee radiographs 06/13/2022 FINDINGS: Bones/Joint/Cartilage The  small linear lucency at the far medial aspect of the proximal medial tibial epiphysis on today's frontal radiograph appears to correspond to a tiny defect seen on coronal series 6, image 85, sagittal series 8, images 102 and 103, and axial series 4, image 103). This has well corticated margins and does not appear to represent an acute fracture. It may be a normal developmental finding for this patient. It may represent the entrance of the trabecular blood vessel. Minimal lateral patellofemoral joint space narrowing. Mild superior and lateral greater than medial peripheral patellar degenerative osteophytes. There is mild-to-moderate chronic enthesopathic spurring at the quadriceps insertion on the patella. Ligaments Suboptimally assessed by CT. Muscles and Tendons Normal density and size of the regional musculature. The quadriceps and patellar tendons appear intact. Soft tissues Mild anterior knee subcutaneous fat edema and swelling. Moderate to high-grade diffuse varicose veins are incidentally noted within the subcutaneous fat. Moderate knee joint effusion. IMPRESSION: 1. Moderate knee joint effusion. 2. No acute fracture is seen. The small linear lucency seen on today's frontal radiograph within the proximal medial tibia is chronic and favored to represent a normal developmental finding, possibly the entrance of a trabecular blood vessel into the bone. 3. Mild-to-moderate chronic enthesopathic spurring at the quadriceps insertion on the patella. 4. Minimal patellofemoral osteoarthritis. Electronically Signed   By: Neita Garnet M.D.   On: 06/13/2022 11:40   US Venous Img Lower Unilateral Right  Result Date: 06/13/2022 CLINICAL DATA:  Hyperextension injury of the right knee. Swelling of the leg. EXAM: Right LOWER EXTREMITY VENOUS DOPPLER ULTRASOUND TECHNIQUE: Gray-scale sonography with compression, as well as color and duplex ultrasound, were performed to evaluate the deep venous system(s) from the level of the  common femoral vein through the popliteal and proximal calf veins. COMPARISON:  CT same day FINDINGS: VENOUS Normal compressibility of the common femoral, superficial femoral, and popliteal veins, as well as the visualized calf veins. Visualized portions of profunda femoral vein and great saphenous vein unremarkable. No filling defects to suggest DVT on grayscale or color Doppler imaging. Doppler waveforms show normal direction of venous flow, normal respiratory plasticity and response to augmentation. Limited views of the contralateral common femoral vein are unremarkable. OTHER None. Limitations: none IMPRESSION: Normal ultrasound of the right lower extremity venous system. No DVT. Electronically Signed   By: Paulina Fusi M.D.   On: 06/13/2022 11:38   DG Knee Complete 4 Views Right  Result Date: 06/13/2022 CLINICAL DATA:  Right knee pain EXAM: RIGHT KNEE - COMPLETE 4+ VIEW COMPARISON:  None Available. FINDINGS: Small cortical defect along the medial tibial plateau. Large joint effusion. Generalized soft tissue swelling. Suprapatellar enthesophyte. There is a probable osteophyte along the lateral tibial spine. IMPRESSION: Small cortical defect along the medial tibial plateau, could reflect nondisplaced fracture. Large joint effusion and generalized  soft tissue swelling of the knee. Electronically Signed   By: Caprice Renshaw M.D.   On: 06/13/2022 10:17    Procedures Procedures    Medications Ordered in ED Medications  HYDROcodone-acetaminophen (NORCO/VICODIN) 5-325 MG per tablet 1 tablet (1 tablet Oral Given 06/13/22 1158)  ketorolac (TORADOL) injection 30 mg (30 mg Intramuscular Given 06/13/22 1213)    ED Course/ Medical Decision Making/ A&P                           Medical Decision Making Amount and/or Complexity of Data Reviewed Radiology: ordered.  Risk Prescription drug management.   61 year old male presenting to the emergency department with right knee pain.  The pain began last  Thursday when he felt a pop and felt something give out.  He denies any recent trauma.  He has had trouble with range of motion of the knee and has had some swelling of the knee.  He denies any fevers or chills.  He denies any redness.  [facility]Sunday and states that he was given an injection but is not sure what it was whether it was steroid or pain medicine.  He states the pain is worse at night and at rest.  He endorses increased swelling and tenderness on both sides of the knee.  On arrival, the patient was vitally stable.  Physical exam significant for Right knee with no significant tenderness palpation about the tibial plateau, mild joint effusion palpated, no significant erythema, minimal tenderness palpation of the joint itself, some pain with range of motion but range of motion overall intact passively, no significant instability on valgus or varus stress.  Distally neurovascular intact with 2+ DP pulses and intact sensation to light touch.  Patient able to raise the leg with extension of the knee.  Differential includes fracture versus ligamentous injury versus tendon injury.  No concern for patella tendon injury or femoral tendon injury.  Considered ligamentous injury within the knee given the patient's pop and pain and swelling.  Also considered fracture although no direct trauma noted.  No evidence of patellar dislocation on exam.  Patient's knee is not warm to the touch, not significantly tender to the touch.  Low concern for gout or septic arthritis at this time.  X-ray imaging was performed and revealed a large joint effusion and generalized soft tissue swelling of the knee with a small cortical defect along the medial tibial plateau, could reflect nondisplaced fracture.  Due to this finding, CT imaging was performed which ultimately resulted negative for acute fracture.  Suspect potential ligamentous injury.  The patient was placed in a knee immobilizer and given  crutches.  He was advised NSAIDs, rest, ice, elevation of the extremity and follow-up outpatient with sports medicine for repeat assessment.  Patient may need MRI of the knee for further evaluation.  Return precautions provided in the event of developing signs and symptoms of septic arthritis.  Final Clinical Impression(s) / ED Diagnoses Final diagnoses:  Acute pain of right knee  Effusion of right knee    Rx / DC Orders ED Discharge Orders          Ordered    AMB referral to sports medicine        07" /26/23 1213              11-19-1984, MD 06/13/22 1906

## 2022-06-13 NOTE — Discharge Instructions (Signed)
Your imaging showed no evidence of fracture in your knee.  You have a small knee effusion.  Your ultrasound was negative for blood clot in your leg.  You may have a ligamentous injury in your knee.  Recommend follow-up in 1 week with sports medicine for repeat assessment and consideration for MRI imaging.  A knee immobilizer has been placed and you have been provided with crutches recommend rest, ice, elevation of the extremity and NSAIDs for pain control.
